# Patient Record
Sex: Female | Born: 1945 | Hispanic: Refuse to answer | Marital: Single | Smoking: Former smoker
Health system: Southern US, Community
[De-identification: ages and names within clinical notes are randomized; demographics above are authoritative.]

## PROBLEM LIST (undated history)

## (undated) DIAGNOSIS — R062 Wheezing: Secondary | ICD-10-CM

## (undated) DIAGNOSIS — R0600 Dyspnea, unspecified: Secondary | ICD-10-CM

## (undated) DIAGNOSIS — I471 Supraventricular tachycardia, unspecified: Secondary | ICD-10-CM

## (undated) DIAGNOSIS — R0609 Other forms of dyspnea: Secondary | ICD-10-CM

## (undated) DIAGNOSIS — I509 Heart failure, unspecified: Secondary | ICD-10-CM

## (undated) DIAGNOSIS — I10 Essential (primary) hypertension: Secondary | ICD-10-CM

## (undated) DIAGNOSIS — M5030 Other cervical disc degeneration, unspecified cervical region: Secondary | ICD-10-CM

---

## 2016-12-12 ENCOUNTER — Other Ambulatory Visit: Payer: Self-pay | Admitting: Family Medicine

## 2016-12-12 DIAGNOSIS — Z780 Asymptomatic menopausal state: Secondary | ICD-10-CM

## 2017-06-29 ENCOUNTER — Encounter: Payer: Self-pay | Admitting: *Deleted

## 2017-07-02 ENCOUNTER — Encounter
Admission: RE | Disposition: A | Discharge status: Home / Self Care | Payer: Self-pay | Source: Ambulatory Visit | Attending: Gastroenterology

## 2017-07-02 ENCOUNTER — Ambulatory Visit
Admission: RE | Admit: 2017-07-02 | Discharge: 2017-07-02 | Disposition: A | Discharge status: Home / Self Care | Payer: Medicare Other | Source: Ambulatory Visit | Attending: Gastroenterology | Admitting: Gastroenterology

## 2017-07-02 ENCOUNTER — Ambulatory Visit: Payer: Medicare Other | Admitting: Anesthesiology

## 2017-07-02 DIAGNOSIS — K640 First degree hemorrhoids: Secondary | ICD-10-CM | POA: Insufficient documentation

## 2017-07-02 DIAGNOSIS — I471 Supraventricular tachycardia: Secondary | ICD-10-CM | POA: Insufficient documentation

## 2017-07-02 DIAGNOSIS — D124 Benign neoplasm of descending colon: Secondary | ICD-10-CM | POA: Insufficient documentation

## 2017-07-02 DIAGNOSIS — Z880 Allergy status to penicillin: Secondary | ICD-10-CM | POA: Insufficient documentation

## 2017-07-02 DIAGNOSIS — K621 Rectal polyp: Secondary | ICD-10-CM | POA: Insufficient documentation

## 2017-07-02 DIAGNOSIS — Z79899 Other long term (current) drug therapy: Secondary | ICD-10-CM | POA: Insufficient documentation

## 2017-07-02 DIAGNOSIS — I110 Hypertensive heart disease with heart failure: Secondary | ICD-10-CM | POA: Insufficient documentation

## 2017-07-02 DIAGNOSIS — D121 Benign neoplasm of appendix: Secondary | ICD-10-CM | POA: Diagnosis not present

## 2017-07-02 DIAGNOSIS — Z1211 Encounter for screening for malignant neoplasm of colon: Secondary | ICD-10-CM | POA: Insufficient documentation

## 2017-07-02 DIAGNOSIS — I509 Heart failure, unspecified: Secondary | ICD-10-CM | POA: Insufficient documentation

## 2017-07-02 DIAGNOSIS — D125 Benign neoplasm of sigmoid colon: Secondary | ICD-10-CM | POA: Insufficient documentation

## 2017-07-02 DIAGNOSIS — K5790 Diverticulosis of intestine, part unspecified, without perforation or abscess without bleeding: Secondary | ICD-10-CM | POA: Insufficient documentation

## 2017-07-02 DIAGNOSIS — K644 Residual hemorrhoidal skin tags: Secondary | ICD-10-CM | POA: Diagnosis not present

## 2017-07-02 HISTORY — DX: Supraventricular tachycardia: I47.1

## 2017-07-02 HISTORY — DX: Wheezing: R06.2

## 2017-07-02 HISTORY — DX: Dyspnea, unspecified: R06.00

## 2017-07-02 HISTORY — DX: Other cervical disc degeneration, unspecified cervical region: M50.30

## 2017-07-02 HISTORY — DX: Essential (primary) hypertension: I10

## 2017-07-02 HISTORY — DX: Other forms of dyspnea: R06.09

## 2017-07-02 HISTORY — DX: Heart failure, unspecified: I50.9

## 2017-07-02 HISTORY — PX: COLONOSCOPY WITH PROPOFOL: SHX5780

## 2017-07-02 HISTORY — DX: Supraventricular tachycardia, unspecified: I47.10

## 2017-07-02 SURGERY — COLONOSCOPY WITH PROPOFOL
Anesthesia: General

## 2017-07-02 MED ORDER — SODIUM CHLORIDE 0.9 % IV SOLN
INTRAVENOUS | Status: DC
  Administered 2017-07-02: 10:00:00 via INTRAVENOUS

## 2017-07-02 MED ORDER — SODIUM CHLORIDE 0.9 % IV SOLN: INTRAVENOUS | Status: DC

## 2017-07-02 MED ORDER — PHENYLEPHRINE HCL 10 MG/ML IJ SOLN
INTRAMUSCULAR | Status: DC | PRN
  Administered 2017-07-02: 100 ug via INTRAVENOUS

## 2017-07-02 MED ORDER — PROPOFOL 500 MG/50ML IV EMUL
INTRAVENOUS | Status: AC
  Filled 2017-07-02: qty 50

## 2017-07-02 MED ORDER — PROPOFOL 500 MG/50ML IV EMUL
INTRAVENOUS | Status: DC | PRN
  Administered 2017-07-02: 125 ug/kg/min via INTRAVENOUS

## 2017-07-02 MED ORDER — PROPOFOL 10 MG/ML IV BOLUS
INTRAVENOUS | Status: DC | PRN
  Administered 2017-07-02: 70 mg via INTRAVENOUS

## 2017-07-02 NOTE — H&P (Signed)
Outpatient short stay form Pre-procedure 07/02/2017 10:08 AM Lollie Sails MD  Primary Physician: Dr. Arrie Aran  Reason for visit:  Colonoscopy  History of present illness:  Patient is a 71 year old female presenting today for colon cancer screening. Her last colonoscopy was in 2003 with a finding of hyperplastic polyps. Patient tolerated her prep well. She takes no aspirin or blood thinning agents.    Current Facility-Administered Medications:  .  0.9 %  sodium chloride infusion, , Intravenous, Continuous, Lollie Sails, MD .  0.9 %  sodium chloride infusion, , Intravenous, Continuous, Lollie Sails, MD  Prescriptions Prior to Admission  Medication Sig Dispense Refill Last Dose  . calcium carbonate (CALTRATE 600) 1500 (600 Ca) MG TABS tablet Take by mouth 2 (two) times daily with a meal.   Past Week at Unknown time  . diltiazem (CARTIA XT) 180 MG 24 hr capsule Take 180 mg by mouth daily.   07/01/2017 at Unknown time  . furosemide (LASIX) 40 MG tablet Take 40 mg by mouth.   07/01/2017 at Unknown time  . metoprolol tartrate (LOPRESSOR) 25 MG tablet Take 25 mg by mouth 2 (two) times daily.   07/01/2017 at Unknown time  . Multiple Vitamin (MULTIVITAMIN) capsule Take 1 capsule by mouth daily.   Past Week at Unknown time  . omega-3 acid ethyl esters (LOVAZA) 1 g capsule Take by mouth 2 (two) times daily.   Past Week at Unknown time  . omeprazole (PRILOSEC) 20 MG capsule Take 20 mg by mouth daily.   07/01/2017 at Unknown time     Allergies  Allergen Reactions  . Metoprolol   . Amoxicillin Rash     Past Medical History:  Diagnosis Date  . CHF (congestive heart failure) ([facility])   . DDD (degenerative disc disease), cervical   . DOE (dyspnea on exertion)   . Hypertension   . SVT (supraventricular tachycardia) ([place])   . Wheeze     Review of systems:      Physical Exam    Heart and lungs: Regular rate and rhythm without rub or gallop, lungs are bilaterally  clear.    HEENT: Normocephalic atraumatic eyes are anicteric    Other:     Pertinant exam for procedure: Soft nontender nondistended bowel sounds positive normoactive.    Planned proceedures: Colonoscopy and indicated procedures. I have discussed the risks benefits and complications of procedures to include not limited to bleeding, infection, perforation and the risk of sedation and the patient wishes to proceed.    Lollie Sails, MD Gastroenterology 07/02/2017  10:08 AM

## 2017-07-02 NOTE — Anesthesia Postprocedure Evaluation (Signed)
Anesthesia Post Note  Patient: LESLEE SUIRE  Procedure(s) Performed: Procedure(s) (LRB): COLONOSCOPY WITH PROPOFOL (N/A)  Patient location during evaluation: Endoscopy Anesthesia Type: General Level of consciousness: awake and alert and oriented Pain management: pain level controlled Vital Signs Assessment: post-procedure vital signs reviewed and stable Respiratory status: spontaneous breathing, nonlabored ventilation and respiratory function stable Cardiovascular status: blood pressure returned to baseline and stable Postop Assessment: no signs of nausea or vomiting Anesthetic complications: no     Last Vitals:  Vitals:   07/02/17 1114 07/02/17 1124  BP: 125/72 134/75  Pulse: 72 70  Resp: 18 18  Temp:      Last Pain:  Vitals:   07/02/17 1054  TempSrc: Tympanic                 Silas Sedam

## 2017-07-02 NOTE — Op Note (Signed)
[facility] Gastroenterology Patient Name: Bridget Daniel Procedure Date: 07/02/2017 10:05 AM MRN: [id number] Account #: [id number] Date of Birth: 1946/05/23 Admit Type: Outpatient Age: 71 Room: [facility] ENDO ROOM 1 Gender: Female Note Status: Finalized Procedure:            Colonoscopy Indications:          Screening for colorectal malignant neoplasm Providers:            Lollie Sails, MD Referring MD:         Ardyth Man. Bobette Mo (Referring MD) Medicines:            Monitored Anesthesia Care Complications:        No immediate complications. Procedure:            Pre-Anesthesia Assessment:                       - ASA Grade Assessment: III - A patient with severe                        systemic disease.                       After obtaining informed consent, the colonoscope was                        passed under direct vision. Throughout the procedure,                        the patient's blood pressure, pulse, and oxygen                        saturations were monitored continuously. The                        Colonoscope was introduced through the anus and                        advanced to the the cecum, identified by appendiceal                        orifice and ileocecal valve. The colonoscopy was                        performed without difficulty. The patient tolerated the                        procedure well. The quality of the bowel preparation                        was good. Findings:      A 2 mm polyp was found in the descending colon. The polyp was sessile.       The polyp was removed with a cold biopsy forceps. Resection and       retrieval were complete.      A 3 mm polyp was found in the appendiceal orifice. The polyp was       sessile. The polyp was removed with a cold biopsy forceps. Resection and       retrieval were complete.      A 3 mm polyp was found in the distal descending colon. The polyp was  sessile. The polyp was removed  with a cold biopsy forceps. Resection and       retrieval were complete.      A 2 mm polyp was found in the distal sigmoid colon. The polyp was       sessile. The polyp was removed with a cold biopsy forceps. Resection and       retrieval were complete.      Four sessile polyps were found in the rectum. The polyps were 1 to 2 mm       in size. These polyps were removed with a cold biopsy forceps. Resection       and retrieval were complete.      The retroflexed view of the distal rectum and anal verge was normal and       showed no anal or rectal abnormalities.      The perianal exam findings include non-thrombosed external hemorrhoids       and internal hemorrhoids (Grade I).      Perianal exam otherwise normal.      Multiple small and large-mouthed diverticula were found in the sigmoid       colon and descending colon. Impression:           - One 2 mm polyp in the descending colon, removed with                        a cold biopsy forceps. Resected and retrieved.                       - One 3 mm polyp at the appendiceal orifice, removed                        with a cold biopsy forceps. Resected and retrieved.                       - One 3 mm polyp in the distal descending colon,                        removed with a cold biopsy forceps. Resected and                        retrieved.                       - One 2 mm polyp in the distal sigmoid colon, removed                        with a cold biopsy forceps. Resected and retrieved.                       - Four 1 to 2 mm polyps in the rectum, removed with a                        cold biopsy forceps. Resected and retrieved.                       - The distal rectum and anal verge are normal on                        retroflexion view.                       -  Non-thrombosed external hemorrhoids and internal                        hemorrhoids (Grade I) found on perianal exam. Recommendation:       - Discharge patient to home. Procedure  Code(s):    --- Professional ---                       [phone], Colonoscopy, flexible; with biopsy, single or                        multiple Diagnosis Code(s):    --- Professional ---                       Z12.11, Encounter for screening for malignant neoplasm                        of colon                       D12.1, Benign neoplasm of appendix                       D12.4, Benign neoplasm of descending colon                       D12.5, Benign neoplasm of sigmoid colon                       K62.1, Rectal polyp                       K64.0, First degree hemorrhoids                       K64.4, Residual hemorrhoidal skin tags CPT copyright 2016 American Medical Association. All rights reserved. The codes documented in this report are preliminary and upon coder review may  be revised to meet current compliance requirements. Lollie Sails, MD 07/02/2017 10:53:31 AM This report has been signed electronically. Number of Addenda: 0 Note Initiated On: 07/02/2017 10:05 AM Scope Withdrawal Time: 0 hours 14 minutes 36 seconds  Total Procedure Duration: 0 hours 29 minutes 38 seconds       [facility]

## 2017-07-02 NOTE — Anesthesia Preprocedure Evaluation (Signed)
Anesthesia Evaluation  Patient identified by MRN, date of birth, ID band Patient awake    Reviewed: Allergy & Precautions, NPO status , Patient's Chart, lab work & pertinent test results  History of Anesthesia Complications Negative for: history of anesthetic complications  Airway Mallampati: III  TM Distance: >3 FB Neck ROM: Full    Dental no notable dental hx.    Pulmonary neg sleep apnea, neg COPD, former smoker,    breath sounds clear to auscultation- rhonchi (-) wheezing      Cardiovascular Exercise Tolerance: Good hypertension, Pt. on medications +CHF and + DOE  (-) CAD, (-) Past MI and (-) Cardiac Stents  Rhythm:Regular Rate:Normal - Systolic murmurs and - Diastolic murmurs NM stress test 05/07/17:  Myocardial perfusion imaging is Normal.  Summed severity score is abnormaldue to soft tissue artifact. .  Overall left ventricular systolic function was Normal without regional  wall motion abnormalities (see above).  No prior studies for comparison    Neuro/Psych negative neurological ROS  negative psych ROS   GI/Hepatic negative GI ROS, Neg liver ROS,   Endo/Other  negative endocrine ROSneg diabetes  Renal/GU negative Renal ROS     Musculoskeletal  (+) Arthritis ,   Abdominal (+) + obese,   Peds  Hematology negative hematology ROS (+)   Anesthesia Other Findings Past Medical History: No date: CHF (congestive heart failure) ([facility]) No date: DDD (degenerative disc disease), cervical No date: DOE (dyspnea on exertion) No date: Hypertension No date: SVT (supraventricular tachycardia) ([facility]) No date: Wheeze   Reproductive/Obstetrics                             Anesthesia Physical Anesthesia Plan  ASA: III  Anesthesia Plan: General   Post-op Pain Management:    Induction: Intravenous  PONV Risk Score and Plan: 2 and Propofol infusion  Airway Management Planned: Natural  Airway  Additional Equipment:   Intra-op Plan:   Post-operative Plan:   Informed Consent: I have reviewed the patients History and Physical, chart, labs and discussed the procedure including the risks, benefits and alternatives for the proposed anesthesia with the patient or authorized representative who has indicated his/her understanding and acceptance.   Dental advisory given  Plan Discussed with: CRNA and Anesthesiologist  Anesthesia Plan Comments:         Anesthesia Quick Evaluation

## 2017-07-02 NOTE — Anesthesia Post-op Follow-up Note (Cosign Needed)
Anesthesia QCDR form completed.        

## 2017-07-02 NOTE — Transfer of Care (Signed)
Immediate Anesthesia Transfer of Care Note  Patient: Bridget Daniel  Procedure(s) Performed: Procedure(s): COLONOSCOPY WITH PROPOFOL (N/A)  Patient Location: PACU  Anesthesia Type:General  Level of Consciousness: awake, alert  and oriented  Airway & Oxygen Therapy: Patient Spontanous Breathing and Patient connected to nasal cannula oxygen  Post-op Assessment: Report given to RN and Post -op Vital signs reviewed and stable  Post vital signs: Reviewed and stable  Last Vitals:  Vitals:   07/02/17 0928  BP: (!) 137/98  Pulse: 91  Resp: 20  Temp: 36.8 C    Last Pain:  Vitals:   07/02/17 0928  TempSrc: Tympanic         Complications: No apparent anesthesia complications

## 2017-07-03 ENCOUNTER — Encounter: Payer: Self-pay | Admitting: Gastroenterology

## 2017-07-03 LAB — SURGICAL PATHOLOGY

## 2017-10-15 ENCOUNTER — Encounter: Payer: Self-pay | Admitting: Dietician

## 2017-10-15 ENCOUNTER — Encounter: Payer: Medicare Other | Attending: Family Medicine | Admitting: Dietician

## 2017-10-15 VITALS — BP 112/80 | Ht 67.0 in | Wt 252.5 lb

## 2017-10-15 DIAGNOSIS — Z6839 Body mass index (BMI) 39.0-39.9, adult: Secondary | ICD-10-CM | POA: Diagnosis not present

## 2017-10-15 DIAGNOSIS — E119 Type 2 diabetes mellitus without complications: Secondary | ICD-10-CM | POA: Insufficient documentation

## 2017-10-15 DIAGNOSIS — Z713 Dietary counseling and surveillance: Secondary | ICD-10-CM | POA: Insufficient documentation

## 2017-10-15 NOTE — Patient Instructions (Addendum)
  Check blood sugars 2 x day before breakfast and 2 hrs after supper every day Bring blood sugar records to the next appointment/class Call your doctor for a prescription for:  1. Meter strips (type) Contour next checking  2 times/day   2. Lancets (type) Microlet checking 2 times per day Eat 2 meals day,   2-3  snacks a day Space meals/snacks 4-5 hours apart Avoid sugar sweetened drinks (soda, tea, coffee, sports drinks, juices) Limit intake of sweets and fried foods Eat 2-3 carbohydrate servings/meal + protein Eat 1-2 carbohydrate servings/snack + protein Make eye doctor appointment Get a Sharps container Return for appointment/classes on:  11-01-17

## 2017-10-15 NOTE — Progress Notes (Signed)
Diabetes Self-Management Education  Visit Type: First/Initial  Appt. Start Time: 1500 Appt. End Time: 1615  10/15/2017  [name], identified by name and date of birth, is a 71 y.o. female with a diagnosis of Diabetes: Type 2.   ASSESSMENT  Blood pressure 112/80, height 5\' 7"  (1.702 m), weight 252 lb 8 oz (114.5 kg). Body mass index is 39.55 kg/m.  Diabetes Self-Management Education - 10/15/17 1906      Visit Information   Visit Type  First/Initial      Initial Visit   Diabetes Type  Type 2      Health Coping   How would you rate your overall health?  Fair      Psychosocial Assessment   Patient Belief/Attitude about Diabetes  Motivated to manage diabetes angry and afraid   angry and afraid   Self-care barriers  None    Other persons present  Patient    Patient Concerns  Weight Control;Glycemic Control    Special Needs  None    Preferred Learning Style  Visual    Learning Readiness  Ready    What is the last grade level you completed in school?  college +      Pre-Education Assessment   Patient understands the diabetes disease and treatment process.  Needs Instruction    Patient understands incorporating nutritional management into lifestyle.  Needs Instruction    Patient undertands incorporating physical activity into lifestyle.  Needs Instruction    Patient understands using medications safely.  Needs Instruction    Patient understands monitoring blood glucose, interpreting and using results  Needs Instruction    Patient understands prevention, detection, and treatment of acute complications.  Needs Instruction    Patient understands prevention, detection, and treatment of chronic complications.  Needs Instruction    Patient understands how to develop strategies to address psychosocial issues.  Needs Instruction    Patient understands how to develop strategies to promote health/change behavior.  Needs Instruction      Complications   Last HgB A1C per  patient/outside source  12 % 09-28-17   09-28-17   How often do you check your blood sugar?  0 times/day (not testing)    Have you had a dental exam in the past 12 months?  No fall 2017   fall 2017   Are you checking your feet?  No swelling at times   swelling at times     Dietary Intake   Breakfast  sleeps til 9a and drinks coffee  and then eats breakfast at 11a=recently eatas boiled egg and whole wheat english muffin or plain oatmeal and apple-does not want to eat any earlier than 11a   Snack (morning)  none    Lunch  skips-does NOT want to eat lunch   Snack (afternoon)  eats snack of fruit, yogurt or whole wheat crackers with cheese at 2:30p    Dinner  eats supper at 5p-meat and vegtables or soup    Snack (evening)  eats fruit, yogurt or whole wheat crackers with cheese at 8:30p    Beverage(s)  drinks 6-7 glasses of water/day and 8+ coffee/diet drinks per day; milk 1x/day      Exercise   Exercise Type  ADL's limited due to CHF and SOB   limited due to CHF, SVT and SOB     Patient Education   Previous Diabetes Education  No    Disease state   Definition of diabetes, type 1 and 2, and the diagnosis of  diabetes;Explored patient's options for treatment of their diabetes;Factors that contribute to the development of diabetes    Nutrition management   Role of diet in the treatment of diabetes and the relationship between the three main macronutrients and blood glucose level;Food label reading, portion sizes and measuring food.;Carbohydrate counting    Physical activity and exercise   Role of exercise on diabetes management, blood pressure control and cardiac health. pt not interested in beginning exercise   pt not interested in beginning exercise   Medications  Reviewed patients medication for diabetes, action, purpose, timing of dose and side effects.    Monitoring  Taught/evaluated SMBG meter.;Taught/discussed recording of test results and interpretation of SMBG.;Identified appropriate  SMBG and/or A1C goals.;Purpose and frequency of SMBG. gave pt Contour Next meter and instructed on its use-BG 138 (ac supper)   gave pt Contour Next meter and instructed on its use-BG 138 (ac supper)   Chronic complications  Relationship between chronic complications and blood glucose control;Reviewed with patient heart disease, higher risk of, and prevention;Retinopathy and reason for yearly dilated eye exams;Nephropathy, what it is, prevention of, the use of ACE, ARB's and early detection of through urine microalbumia.    Psychosocial adjustment  Role of stress on diabetes    Personal strategies to promote health  Lifestyle issues that need to be addressed for better diabetes care;Helped patient develop diabetes management plan for (enter comment)       Individualized Plan for Diabetes Self-Management Training:   Learning Objective:  Patient will have a greater understanding of diabetes self-management. Patient education plan is to attend individual and/or group sessions per assessed needs and concerns.   Plan:   Patient Instructions   Check blood sugars 2 x day before breakfast and 2 hrs after supper every day Bring blood sugar records to the next appointment/class Call your doctor for a prescription for:  1. Meter strips (type) Contour next checking  2 times/day   2. Lancets (type) Microlet checking 2 times per day Eat 2 meals day,   2-3  snacks a day Space meals/snacks 4-5 hours apart Avoid sugar sweetened drinks (soda, tea, coffee, sports drinks, juices) Limit intake of sweets and fried foods Eat 2-3 carbohydrate servings/meal + protein Eat 1-2 carbohydrate servings/snack + protein Make eye doctor appointment Get a Sharps container Return for appointment/classes on:  11-01-17   Expected Outcomes:   positive  Education material provided: general meal planning guidelines, Contour Next meter  If problems or questions, patient to contact team via:  [phone]  Future DSME  appointment:  11-01-17

## 2017-11-01 ENCOUNTER — Encounter: Payer: Self-pay | Admitting: Dietician

## 2017-11-01 ENCOUNTER — Encounter: Payer: Medicare Other | Admitting: Dietician

## 2017-11-01 VITALS — Ht 67.0 in | Wt 249.3 lb

## 2017-11-01 DIAGNOSIS — Z713 Dietary counseling and surveillance: Secondary | ICD-10-CM | POA: Diagnosis not present

## 2017-11-01 DIAGNOSIS — E119 Type 2 diabetes mellitus without complications: Secondary | ICD-10-CM

## 2017-11-01 NOTE — Progress Notes (Signed)

## 2017-11-08 ENCOUNTER — Encounter: Payer: Self-pay | Admitting: *Deleted

## 2017-11-08 ENCOUNTER — Encounter: Payer: Medicare Other | Attending: Family Medicine | Admitting: *Deleted

## 2017-11-08 VITALS — Wt 246.4 lb

## 2017-11-08 DIAGNOSIS — Z713 Dietary counseling and surveillance: Secondary | ICD-10-CM | POA: Diagnosis not present

## 2017-11-08 DIAGNOSIS — E119 Type 2 diabetes mellitus without complications: Secondary | ICD-10-CM | POA: Diagnosis not present

## 2017-11-08 DIAGNOSIS — Z6839 Body mass index (BMI) 39.0-39.9, adult: Secondary | ICD-10-CM | POA: Insufficient documentation

## 2017-11-08 NOTE — Progress Notes (Signed)

## 2017-11-15 ENCOUNTER — Encounter: Payer: Self-pay | Admitting: Dietician

## 2017-11-15 ENCOUNTER — Encounter: Payer: Medicare Other | Admitting: Dietician

## 2017-11-15 VITALS — BP 120/78 | Ht 67.0 in | Wt 249.5 lb

## 2017-11-15 DIAGNOSIS — E119 Type 2 diabetes mellitus without complications: Secondary | ICD-10-CM

## 2017-11-15 DIAGNOSIS — Z713 Dietary counseling and surveillance: Secondary | ICD-10-CM | POA: Diagnosis not present

## 2017-11-15 NOTE — Progress Notes (Signed)

## 2017-11-20 ENCOUNTER — Encounter: Payer: Self-pay | Admitting: Dietician

## 2017-11-20 NOTE — Progress Notes (Signed)
Discharge letter faxed to MD

## 2018-01-09 ENCOUNTER — Other Ambulatory Visit: Payer: Self-pay | Admitting: Family Medicine

## 2018-01-09 DIAGNOSIS — Z780 Asymptomatic menopausal state: Secondary | ICD-10-CM

## 2018-02-03 ENCOUNTER — Other Ambulatory Visit: Payer: Self-pay

## 2018-02-03 ENCOUNTER — Encounter: Payer: Self-pay | Admitting: *Deleted

## 2018-02-03 ENCOUNTER — Ambulatory Visit
Admission: EM | Admit: 2018-02-03 | Discharge: 2018-02-03 | Disposition: A | Discharge status: Home / Self Care | Payer: Medicare Other | Attending: Family Medicine | Admitting: Family Medicine

## 2018-02-03 DIAGNOSIS — J020 Streptococcal pharyngitis: Secondary | ICD-10-CM | POA: Insufficient documentation

## 2018-02-03 DIAGNOSIS — H6502 Acute serous otitis media, left ear: Secondary | ICD-10-CM | POA: Diagnosis not present

## 2018-02-03 DIAGNOSIS — J029 Acute pharyngitis, unspecified: Secondary | ICD-10-CM | POA: Diagnosis present

## 2018-02-03 LAB — RAPID STREP SCREEN (MED CTR MEBANE ONLY): Streptococcus, Group A Screen (Direct): POSITIVE — AB

## 2018-02-03 MED ORDER — CLINDAMYCIN HCL 300 MG PO CAPS: 300.0000 mg | ORAL_CAPSULE | Freq: Three times a day (TID) | ORAL | 0 refills | Status: AC

## 2018-02-03 MED ORDER — LIDOCAINE VISCOUS 2 % MT SOLN: OROMUCOSAL | 0 refills | Status: AC

## 2018-02-03 NOTE — ED Provider Notes (Signed)
[facility]    CSN: [id number] Arrival date & time: 02/03/18  1041     History   Chief Complaint Chief Complaint  Patient presents with  . Otalgia  . Sore Throat    HPI Bridget Daniel is a 72 y.o. female.   The history is provided by the patient.  URI  Presenting symptoms: congestion, ear pain, fever and sore throat   Severity:  Moderate Onset quality:  Sudden Duration:  5 days Timing:  Constant Progression:  Worsening Chronicity:  New Relieved by:  Nothing Ineffective treatments:  OTC medications Associated symptoms: no wheezing   Risk factors: being elderly, chronic cardiac disease, diabetes mellitus and sick contacts   Risk factors: no chronic kidney disease, no chronic respiratory disease, no immunosuppression, no recent illness and no recent travel     Past Medical History:  Diagnosis Date  . CHF (congestive heart failure) ([place])   . DDD (degenerative disc disease), cervical   . DOE (dyspnea on exertion)   . Hypertension   . SVT (supraventricular tachycardia) ([place])   . Wheeze     There are no active problems to display for this patient.   Past Surgical History:  Procedure Laterality Date  . COLONOSCOPY WITH PROPOFOL N/A 07/02/2017   Procedure: COLONOSCOPY WITH PROPOFOL;  Surgeon: Lollie Sails, MD;  Location: [facility];  Service: Endoscopy;  Laterality: N/A;    OB History    No data available       Home Medications    Prior to Admission medications   Medication Sig Start Date End Date Taking? Authorizing Provider  calcium carbonate (CALTRATE 600) 1500 (600 Ca) MG TABS tablet Take 2 (two) times daily with a meal by mouth. Takes 1 tablet daily   Yes [provider]  diltiazem (CARTIA XT) 180 MG 24 hr capsule Take 180 mg by mouth daily.   Yes [provider]  furosemide (LASIX) 40 MG tablet Take 40 mg 2 (two) times daily by mouth.    Yes [provider]  metFORMIN (GLUCOPHAGE) 1000 MG tablet Take 1  tablet 2 (two) times daily by mouth. 10/03/17 10/03/18 Yes [provider]  metoprolol tartrate (LOPRESSOR) 25 MG tablet Take 12.5 mg 2 (two) times daily by mouth.    Yes [provider]  Multiple Vitamin (MULTIVITAMIN) capsule Take 1 capsule by mouth daily.   Yes [provider]  omega-3 acid ethyl esters (LOVAZA) 1 g capsule Take by mouth 2 (two) times daily.   Yes [provider]  omeprazole (PRILOSEC) 20 MG capsule Take 20 mg by mouth daily.   Yes [provider]  pravastatin (PRAVACHOL) 20 MG tablet Take 1 tablet at bedtime by mouth. 10/03/17 10/03/18 Yes [provider]  clindamycin (CLEOCIN) 300 MG capsule Take 1 capsule (300 mg total) by mouth 3 (three) times daily. 02/03/18   Norval Gable, MD  lidocaine (XYLOCAINE) 2 % solution 20 ml gargle and spit q 6 hours as needed for sore throat 02/03/18   Norval Gable, MD    Family History History reviewed. No pertinent family history.  Social History Social History   Tobacco Use  . Smoking status: Former Smoker    Last attempt to quit: 04/29/2014    Years since quitting: 3.7  . Smokeless tobacco: Never Used  Substance Use Topics  . Alcohol use: Yes    Alcohol/week: 0.0 - 0.6 oz  . Drug use: No     Allergies   Metoprolol and Amoxicillin  Review of Systems Review of Systems  Constitutional: Positive for fever.  HENT: Positive for congestion, ear pain and sore throat.   Respiratory: Negative for wheezing.      Physical Exam Triage Vital Signs ED Triage Vitals  Enc Vitals Group     BP 02/03/18 1152 118/73     Pulse Rate 02/03/18 1152 90     Resp 02/03/18 1152 16     Temp 02/03/18 1152 98.7 F (37.1 C)     Temp Source 02/03/18 1152 Oral     SpO2 02/03/18 1152 96 %     Weight 02/03/18 1155 240 lb (108.9 kg)     Height 02/03/18 1155 5\' 6"  (1.676 m)     Head Circumference --      Peak Flow --      Pain Score 02/03/18 1154 5     Pain Loc --      Pain Edu? --       Excl. in [place]? --    No data found.  Updated Vital Signs BP 118/73 (BP Location: Left Arm)   Pulse 90   Temp 98.7 F (37.1 C) (Oral)   Resp 16   Ht 5\' 6"  (1.676 m)   Wt 240 lb (108.9 kg)   SpO2 96%   BMI 38.74 kg/m   Visual Acuity Right Eye Distance:   Left Eye Distance:   Bilateral Distance:    Right Eye Near:   Left Eye Near:    Bilateral Near:     Physical Exam  Constitutional: She appears well-developed and well-nourished. No distress.  HENT:  Head: Normocephalic and atraumatic.  Right Ear: Tympanic membrane, external ear and ear canal normal.  Left Ear: External ear and ear canal normal. Tympanic membrane is erythematous and bulging. A middle ear effusion is present.  Nose: Mucosal edema and rhinorrhea present. No nose lacerations, sinus tenderness, nasal deformity, septal deviation or nasal septal hematoma. No epistaxis.  No foreign bodies. Right sinus exhibits no maxillary sinus tenderness and no frontal sinus tenderness. Left sinus exhibits no maxillary sinus tenderness and no frontal sinus tenderness.  Mouth/Throat: Uvula is midline and mucous membranes are normal. Oropharyngeal exudate and posterior oropharyngeal erythema present. No posterior oropharyngeal edema or tonsillar abscesses. No tonsillar exudate.  Eyes: Conjunctivae are normal. Right eye exhibits no discharge. Left eye exhibits no discharge. No scleral icterus.  Neck: Normal range of motion. Neck supple. No thyromegaly present.  Cardiovascular: Normal rate, regular rhythm and normal heart sounds.  Pulmonary/Chest: Effort normal and breath sounds normal. No stridor. No respiratory distress. She has no wheezes. She has no rales.  Lymphadenopathy:    She has no cervical adenopathy.  Skin: She is not diaphoretic.  Nursing note and vitals reviewed.    UC Treatments / Results  Labs (all labs ordered are listed, but only abnormal results are displayed) Labs Reviewed  RAPID STREP SCREEN (NOT AT [facility]) -  Abnormal; Notable for the following components:      Result Value   Streptococcus, Group A Screen (Direct) POSITIVE (*)    All other components within normal limits    EKG  EKG Interpretation None       Radiology No results found.  Procedures Procedures (including critical care time)  Medications Ordered in UC Medications - No data to display   Initial Impression / Assessment and Plan / UC Course  I have reviewed the triage vital signs and the nursing notes.  Pertinent labs & imaging results that were available  during my care of the patient were reviewed by me and considered in my medical decision making (see chart for details).      Final Clinical Impressions(s) / UC Diagnoses   Final diagnoses:  Strep pharyngitis  Acute serous otitis media of left ear, recurrence not specified    ED Discharge Orders        Ordered    clindamycin (CLEOCIN) 300 MG capsule  3 times daily     02/03/18 1240    lidocaine (XYLOCAINE) 2 % solution     02/03/18 1240     1. Lab results and diagnosis reviewed with patient 2. rx as per orders above; reviewed possible side effects, interactions, risks and benefits  3. Recommend supportive treatment with rest, fluids, otc analgesics prn 4. Follow-up prn if symptoms worsen or don't improve  Controlled Substance Prescriptions [place] Controlled Substance Registry consulted? Not Applicable   Norval Gable, MD 02/03/18 1250

## 2018-02-03 NOTE — ED Triage Notes (Signed)
Patient started having symptoms of cough sore throat and left ear pain 5 days ago.

## 2018-07-02 ENCOUNTER — Encounter: Payer: Self-pay | Admitting: Dietician

## 2020-10-11 ENCOUNTER — Ambulatory Visit
Admission: RE | Admit: 2020-10-11 | Discharge: 2020-10-11 | Disposition: A | Discharge status: Home / Self Care | Payer: Medicare Other | Source: Ambulatory Visit | Attending: Family Medicine | Admitting: Family Medicine

## 2020-10-11 ENCOUNTER — Ambulatory Visit
Admission: RE | Admit: 2020-10-11 | Discharge: 2020-10-11 | Disposition: A | Discharge status: Home / Self Care | Payer: Medicare Other | Attending: Diagnostic Radiology | Admitting: Diagnostic Radiology

## 2020-10-11 ENCOUNTER — Other Ambulatory Visit: Payer: Self-pay | Admitting: Family Medicine

## 2020-10-11 ENCOUNTER — Other Ambulatory Visit: Payer: Self-pay

## 2020-10-11 DIAGNOSIS — W19XXXA Unspecified fall, initial encounter: Secondary | ICD-10-CM

## 2020-10-11 DIAGNOSIS — S52202A Unspecified fracture of shaft of left ulna, initial encounter for closed fracture: Secondary | ICD-10-CM | POA: Diagnosis not present

## 2020-10-11 DIAGNOSIS — M79642 Pain in left hand: Secondary | ICD-10-CM | POA: Insufficient documentation

## 2020-10-11 DIAGNOSIS — M79645 Pain in left finger(s): Secondary | ICD-10-CM | POA: Insufficient documentation

## 2021-04-08 IMAGING — CR DG HAND COMPLETE 3+V*L*
3 series · 3 of 3 positions shown · non-contrast
Comparison: None.

CLINICAL DATA: Left hand pain after fall

EXAM:
LEFT HAND - COMPLETE 3+ VIEW

[hand ap]
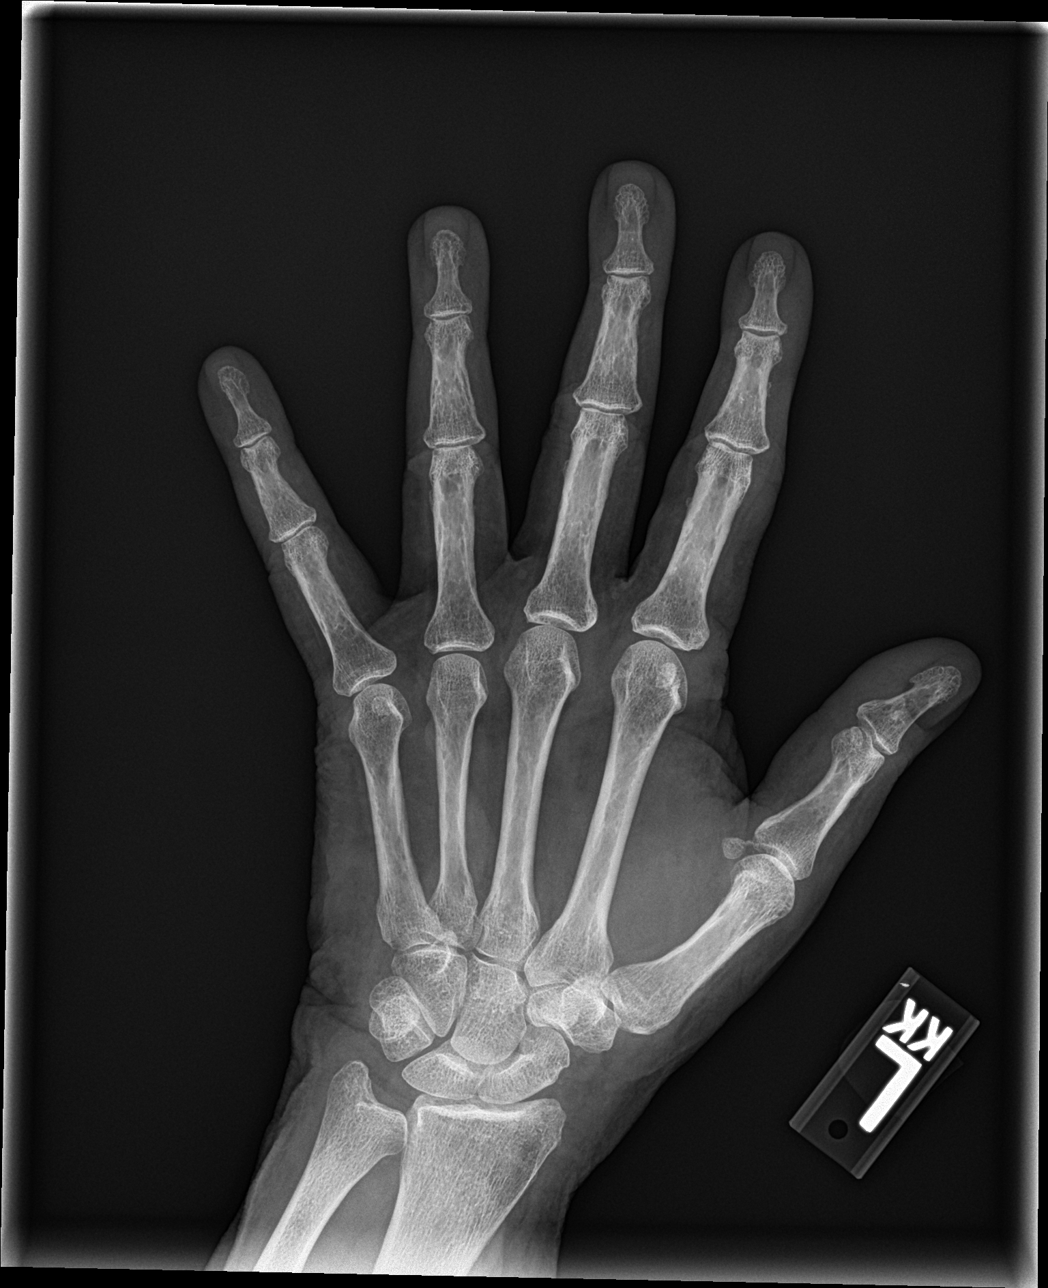

[hand obl]
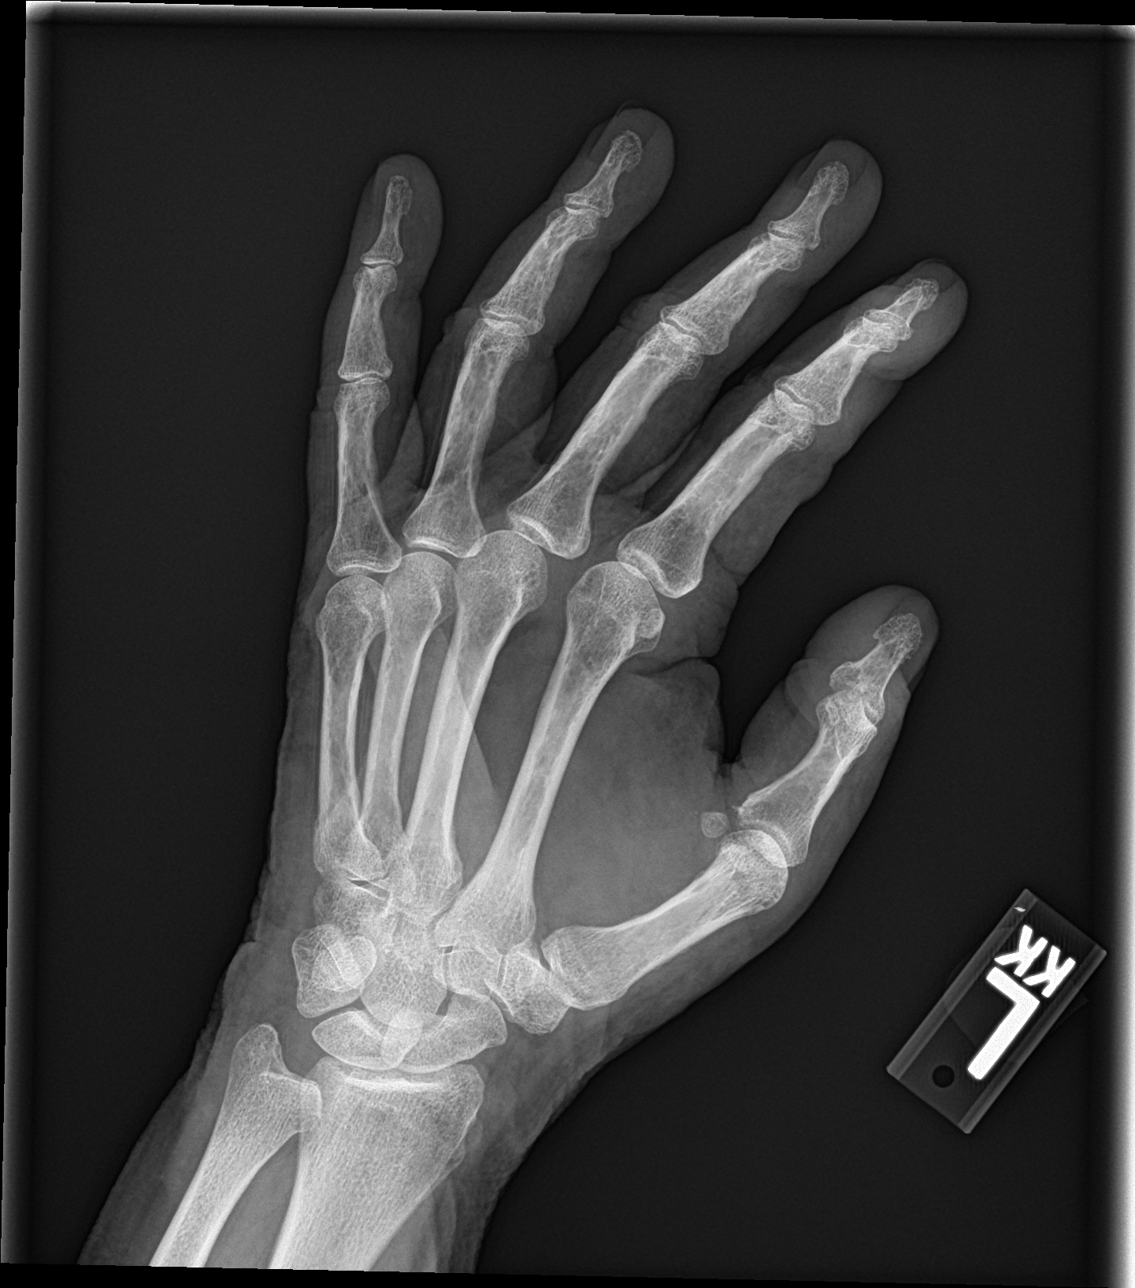

[hand lat]
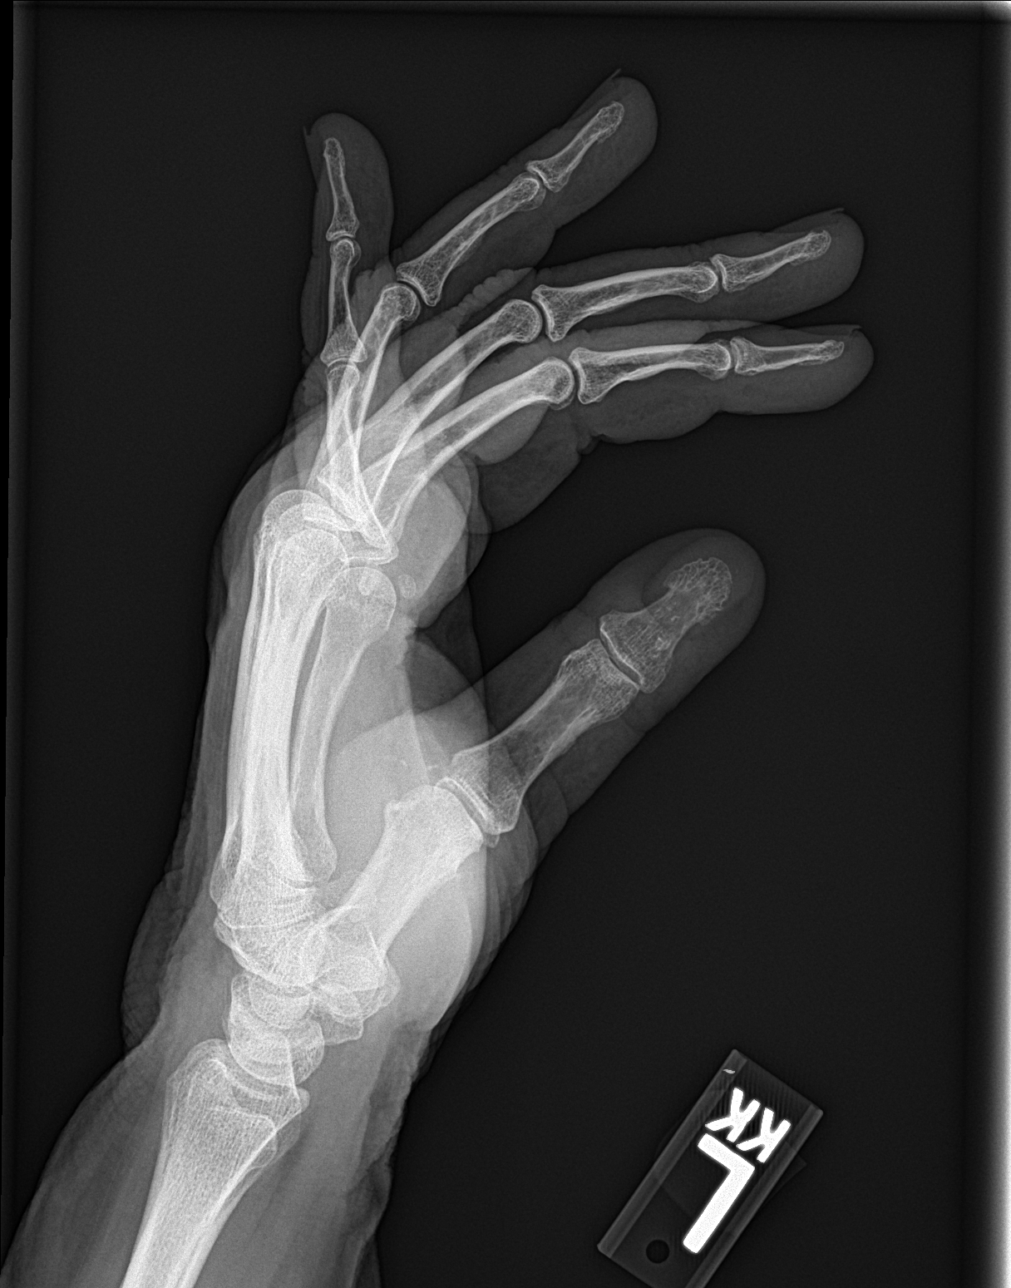

[3 of 3 positions shown; findings below may reference images not displayed]

FINDINGS: Nonspecific mineralized density along the ulnar and volar aspects of
the base of the thumb proximal phalanx, possibly small fracture
fragments. Elsewhere, no acute fracture. No dislocation. Mild joint
space narrowing involving the interphalangeal joints of the hand.
Soft tissues within normal limits.
IMPRESSION: Nonspecific mineralized densities along the ulnar and volar aspects
of the base of the thumb proximal phalanx, possibly small fracture
fragments. Correlate with point tenderness.
